# Patient Record
Sex: Male | Born: 1955 | Race: Black or African American | Hispanic: No | Marital: Married | Smoking: Current every day smoker
Health system: Southern US, Community
[De-identification: ages and names within clinical notes are randomized; demographics above are authoritative.]

## PROBLEM LIST (undated history)

## (undated) DIAGNOSIS — E119 Type 2 diabetes mellitus without complications: Secondary | ICD-10-CM

## (undated) DIAGNOSIS — I10 Essential (primary) hypertension: Secondary | ICD-10-CM

## (undated) HISTORY — DX: Essential (primary) hypertension: I10

## (undated) HISTORY — DX: Type 2 diabetes mellitus without complications: E11.9

## (undated) HISTORY — PX: APPENDECTOMY: SHX54

---

## 2016-08-06 HISTORY — PX: CERVICAL DISC SURGERY: SHX588

## 2020-08-06 DIAGNOSIS — Z85528 Personal history of other malignant neoplasm of kidney: Secondary | ICD-10-CM

## 2020-08-06 HISTORY — DX: Personal history of other malignant neoplasm of kidney: Z85.528

## 2021-10-05 ENCOUNTER — Other Ambulatory Visit: Payer: Self-pay | Admitting: Internal Medicine

## 2021-10-05 DIAGNOSIS — N1831 Chronic kidney disease, stage 3a: Secondary | ICD-10-CM

## 2021-10-10 ENCOUNTER — Ambulatory Visit
Admission: RE | Admit: 2021-10-10 | Discharge: 2021-10-10 | Disposition: A | Discharge status: Home / Self Care | Payer: Medicare HMO | Source: Ambulatory Visit | Attending: Internal Medicine | Admitting: Internal Medicine

## 2021-10-10 DIAGNOSIS — N1831 Chronic kidney disease, stage 3a: Secondary | ICD-10-CM

## 2022-01-22 ENCOUNTER — Other Ambulatory Visit: Payer: Self-pay | Admitting: Internal Medicine

## 2022-01-22 DIAGNOSIS — F17200 Nicotine dependence, unspecified, uncomplicated: Secondary | ICD-10-CM

## 2022-02-20 ENCOUNTER — Ambulatory Visit
Admission: RE | Admit: 2022-02-20 | Discharge: 2022-02-20 | Disposition: A | Discharge status: Home / Self Care | Payer: Medicare HMO | Source: Ambulatory Visit | Attending: Internal Medicine | Admitting: Internal Medicine

## 2022-02-20 DIAGNOSIS — F17200 Nicotine dependence, unspecified, uncomplicated: Secondary | ICD-10-CM

## 2022-03-05 ENCOUNTER — Encounter: Payer: Self-pay | Admitting: Internal Medicine

## 2022-04-04 ENCOUNTER — Ambulatory Visit: Payer: Medicare HMO | Admitting: Internal Medicine

## 2022-05-02 ENCOUNTER — Ambulatory Visit (INDEPENDENT_AMBULATORY_CARE_PROVIDER_SITE_OTHER): Payer: Medicare HMO | Admitting: Internal Medicine

## 2022-05-02 ENCOUNTER — Encounter: Payer: Self-pay | Admitting: Internal Medicine

## 2022-05-02 VITALS — BP 116/66 | HR 78 | Ht 69.5 in | Wt 216.0 lb

## 2022-05-02 DIAGNOSIS — D509 Iron deficiency anemia, unspecified: Secondary | ICD-10-CM | POA: Diagnosis not present

## 2022-05-02 DIAGNOSIS — K219 Gastro-esophageal reflux disease without esophagitis: Secondary | ICD-10-CM

## 2022-05-02 DIAGNOSIS — Z1211 Encounter for screening for malignant neoplasm of colon: Secondary | ICD-10-CM

## 2022-05-02 MED ORDER — NA SULFATE-K SULFATE-MG SULF 17.5-3.13-1.6 GM/177ML PO SOLN: 1.0000 | Freq: Once | ORAL | 0 refills | Status: AC

## 2022-05-02 MED ORDER — PANTOPRAZOLE SODIUM 40 MG PO TBEC: 40.0000 mg | DELAYED_RELEASE_TABLET | Freq: Every day | ORAL | 3 refills | Status: AC

## 2022-05-02 NOTE — Progress Notes (Addendum)
HISTORY OF PRESENT ILLNESS:  Kerry Ayers is a 66 y.o. male, native of [place] who relocates to [facility] via [place], with a history of diabetes mellitus, chronic renal insufficiency, hypertension, coronary artery disease, and chronic tobacco abuse.  He is a retired Buyer, retail.  He is sent today by Dr. Gerarda Fraction regarding iron deficiency anemia.  The patient states that he is here because he has never had a colonoscopy.  He has no lower GI complaints.  No family history of colon cancer.  He was apparently diagnosed with iron deficiency anemia.  We had to request outside records.  Laboratories were faxed in.  Of significance creatinine 1.58 with hemoglobin A1c of 6.5.  He did undergo Hemoccult testing in March.  This was negative.  I do not have CBC or iron levels.  However he was placed on iron replacement, which she continues.  Next, he complains of chronic reflux.  Constantly needs Tums to control symptoms.  No dysphagia.  As mentioned, he is a chronic smoker.  Drinks alcohol on weekends.  REVIEW OF SYSTEMS:  All non-GI ROS negative unless otherwise stated in the HPI.  Past Medical History:  Diagnosis Date   Diabetes [facility])    History of kidney cancer 2022   Hypertension     Past Surgical History:  Procedure Laterality Date   APPENDECTOMY     CERVICAL [place] SURGERY  2018    Social History Kerry Ayers  reports that he has been smoking cigarettes. He has never used smokeless tobacco. He reports current alcohol use. He reports that he does not use drugs.  family history includes Emphysema in his father.  No Known Allergies     PHYSICAL EXAMINATION: Vital signs: BP 116/66   Pulse 78   Ht 5' 9.5" (1.765 m)   Wt 216 lb (98 kg)   SpO2 99%   BMI 31.44 kg/m   Constitutional: generally well-appearing, no acute distress.  Reeks of tobacco smoke Psychiatric: alert and oriented x3, cooperative Eyes: extraocular movements intact, anicteric, conjunctiva pink Mouth:  oral pharynx moist, no lesions.  Poor dentition Neck: supple no lymphadenopathy Cardiovascular: heart regular rate and rhythm, no murmur Lungs: clear to auscultation bilaterally Abdomen: soft, nontender, nondistended, no obvious ascites, no peritoneal signs, normal bowel sounds, no organomegaly Rectal: Deferred until colonoscopy Extremities: no clubbing, cyanosis, or lower extremity edema bilaterally Skin: no lesions on visible extremities Neuro: No focal deficits.  Cranial nerves intact  ASSESSMENT:  1.  Iron deficiency anemia.  Rule out GI mucosal lesion. 2.  Colon cancer screening.  No prior colonoscopy.  Overdue.  Motivated. 3.  Chronic GERD treated with chronic antacids. 4.  Chronic tobacco abuse 5.  General medical problems including diabetes and reported history of coronary artery disease.   PLAN:  1.  Reflux precautions 2.  Prescribe pantoprazole 40 mg daily.  Medication risks reviewed 3.  Schedule colonoscopy for colon cancer screening and evaluate iron deficiency anemia.The nature of the procedure, as well as the risks, benefits, and alternatives were carefully and thoroughly reviewed with the patient. Ample time for discussion and questions allowed. The patient understood, was satisfied, and agreed to proceed.  4.  Schedule upper endoscopy to evaluate chronic GERD and iron deficiency anemia.The nature of the procedure, as well as the risks, benefits, and alternatives were carefully and thoroughly reviewed with the patient. Ample time for discussion and questions allowed. The patient understood, was satisfied, and agreed to proceed.  5.  Hold diabetic medications the day of the procedure  in order to avoid unwanted hypoglycemia as well as iron 5 to 7 days prior to the procedures. 6.  Stop smoking 7.  Further recommendations after the above A total time of 60 minutes was spent preparing to see the patient, reviewing outside data, obtaining comprehensive history, performing  medically appropriate physical examination, counseling and educating the patient regarding the above listed issues, ordering medications, ordering multiple endoscopic procedures, and documenting clinical information in the health record.  ADDENDUM May 08, 2022: 1.  Received and reviewed outside laboratories dated October 04, 2021.  Normal ferritin level of 81.  Normal B12 336.  Normal iron studies, folate, AFP, uric acid, and phosphorus.  Low vitamin D at 9.5.  HIV testing was negative.

## 2022-05-02 NOTE — Patient Instructions (Signed)
_______________________________________________________  If you are age 66 or older, your body mass index should be between 23-30. Your Body mass index is 31.44 kg/m. If this is out of the aforementioned range listed, please consider follow up with your Primary Care Provider.  If you are age 57 or younger, your body mass index should be between 19-25. Your Body mass index is 31.44 kg/m. If this is out of the aformentioned range listed, please consider follow up with your Primary Care Provider.   ________________________________________________________  The [facility] providers would like to encourage you to use [facility] to communicate with providers for non-urgent requests or questions.  Due to long hold times on the telephone, sending your provider a message by [facility] may be a faster and more efficient way to get a response.  Please allow 48 business hours for a response.  Please remember that this is for non-urgent requests.  _______________________________________________________  We have sent the following medications to your pharmacy for you to pick up at your convenience:  Pantoprazole  You have been scheduled for an endoscopy and colonoscopy. Please follow the written instructions given to you at your visit today. Please pick up your prep supplies at the pharmacy within the next 1-3 days. If you use inhalers (even only as needed), please bring them with you on the day of your procedure.

## 2022-05-29 ENCOUNTER — Encounter: Payer: Medicare HMO | Admitting: Internal Medicine

## 2023-08-12 IMAGING — US US ABDOMEN COMPLETE
1 series · 14 of 25 positions shown · non-contrast
Comparison: None.

CLINICAL DATA: Stage IIIA chronic kidney disease.

EXAM:
ABDOMEN ULTRASOUND COMPLETE

[Series 1: us abdomen complete · 0.20mm/px · 14 of 102 slices shown]
[im 1/102]
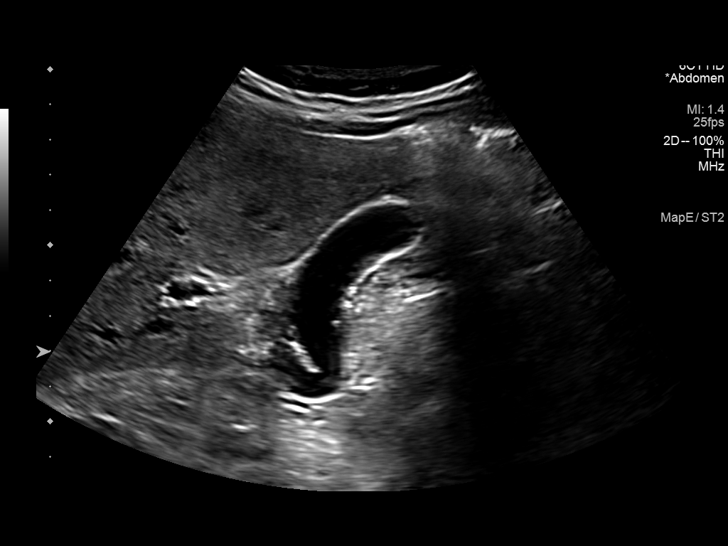
[im 9/102]
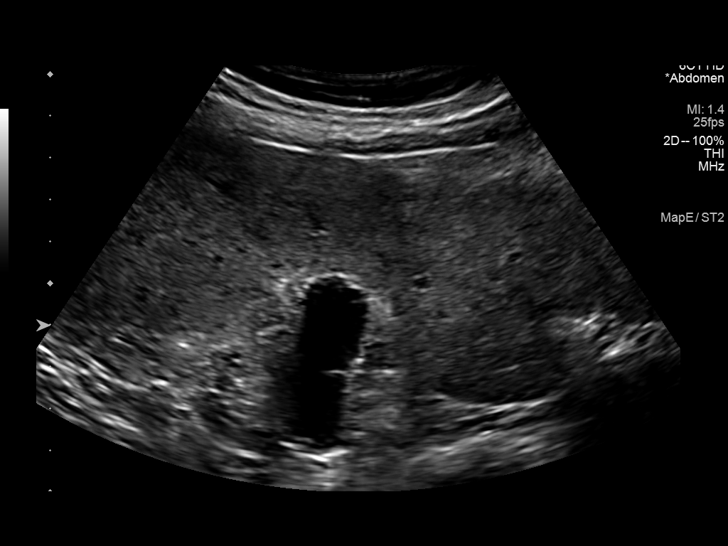
[im 17/102]
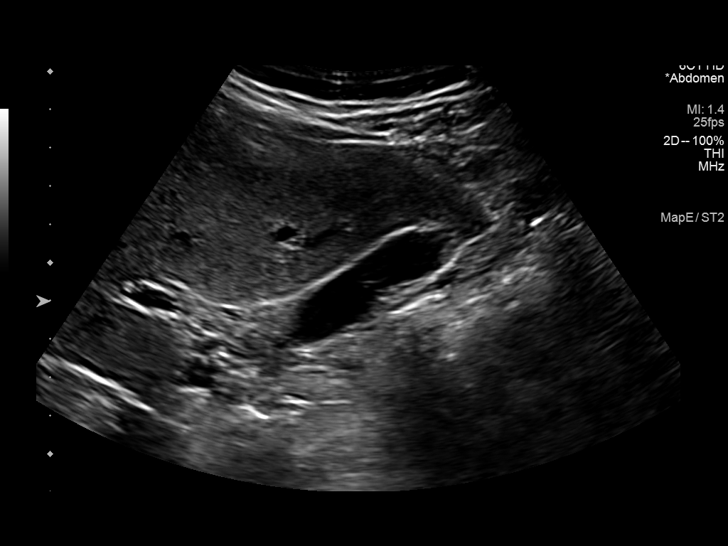
[im 26/102]
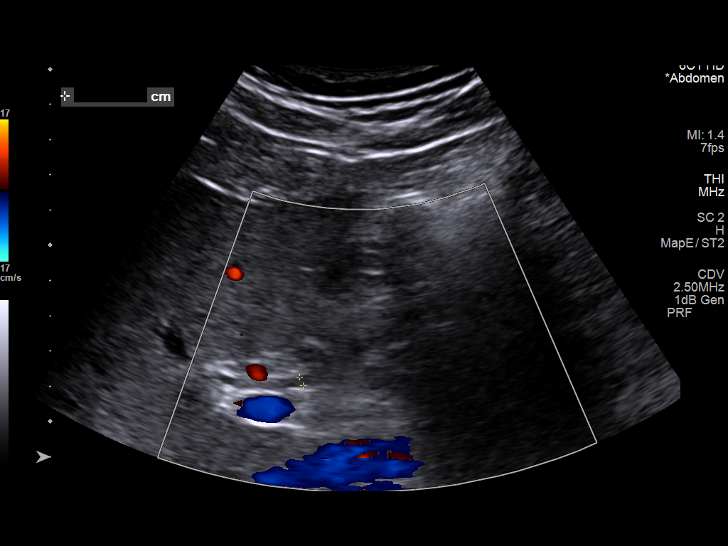
[im 34/102]
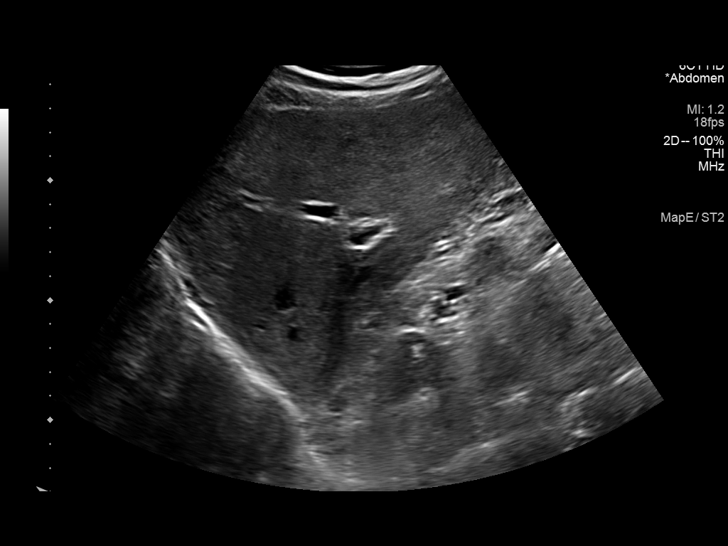
[im 38/102]
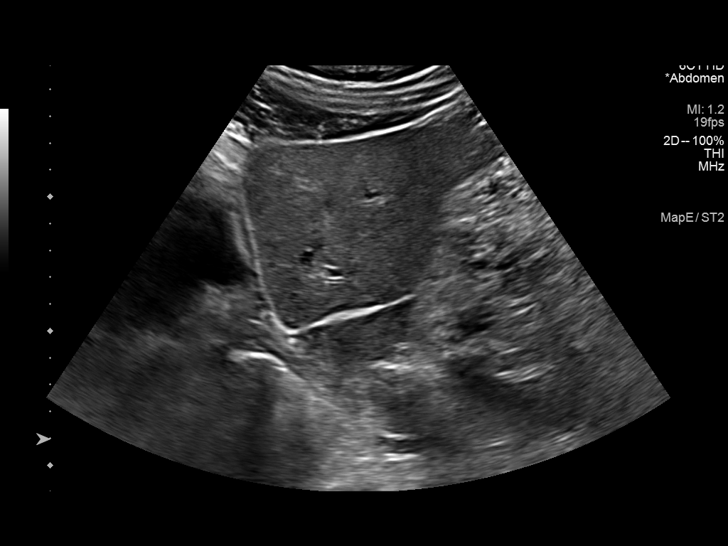
[im 47/102]
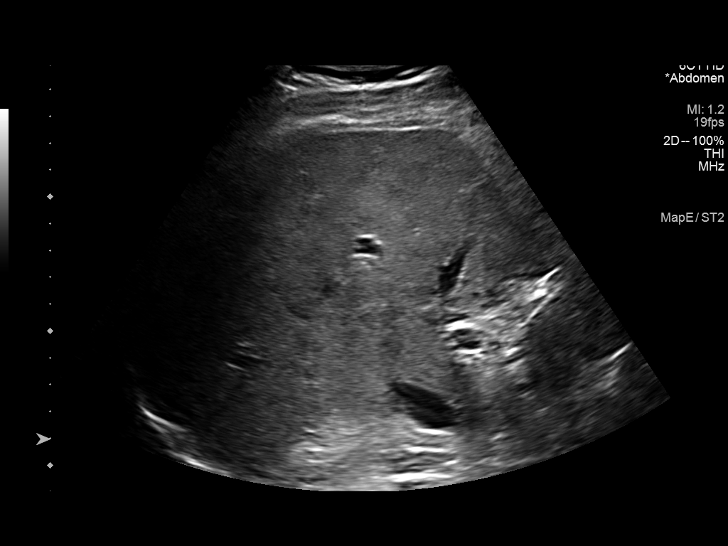
[im 55/102]
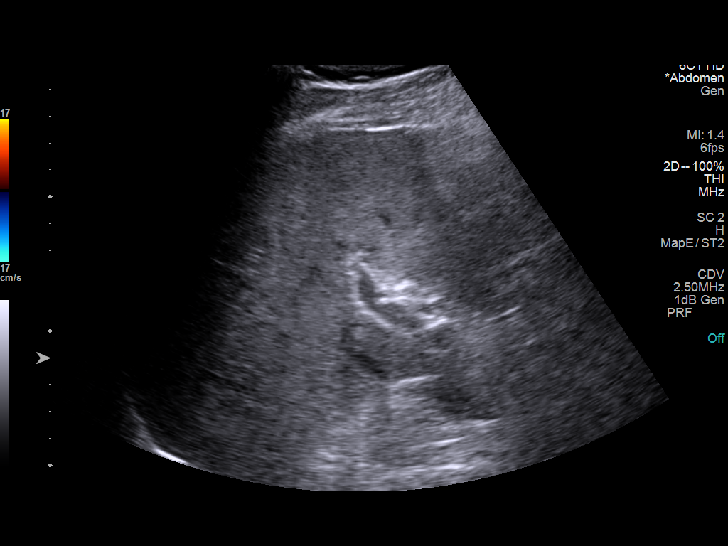
[im 64/102]
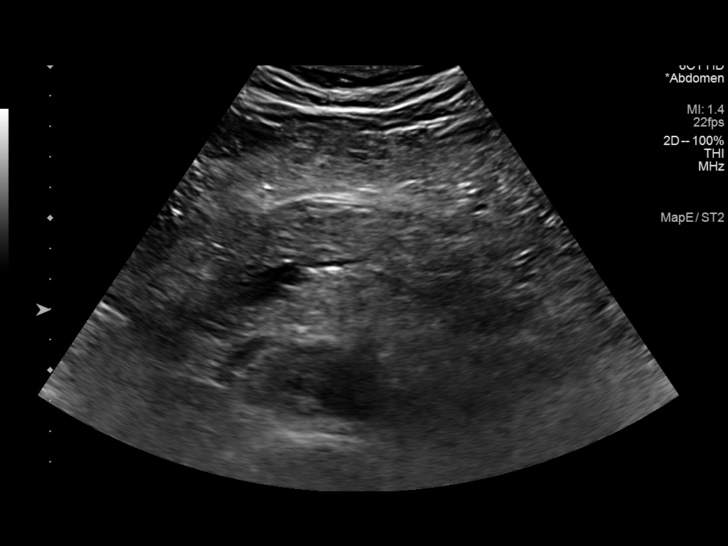
[im 68/102]
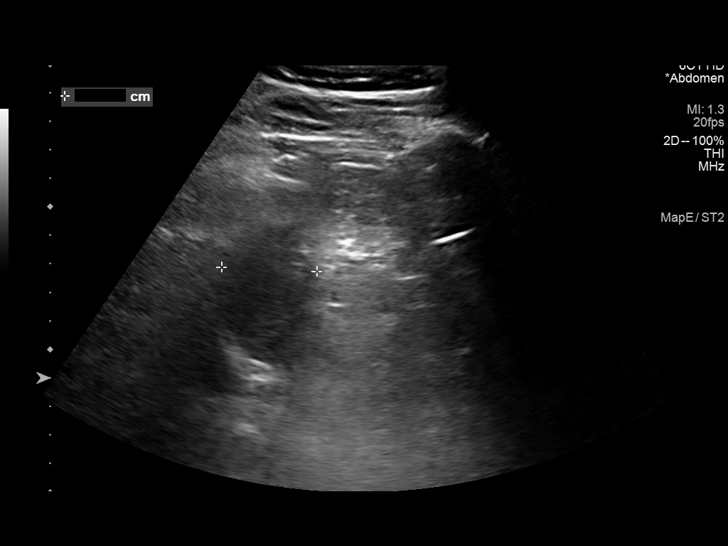
[im 76/102]
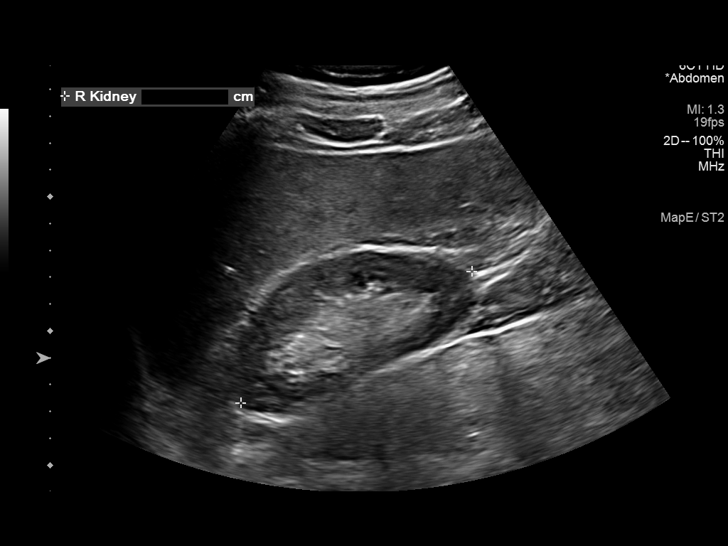
[im 85/102]
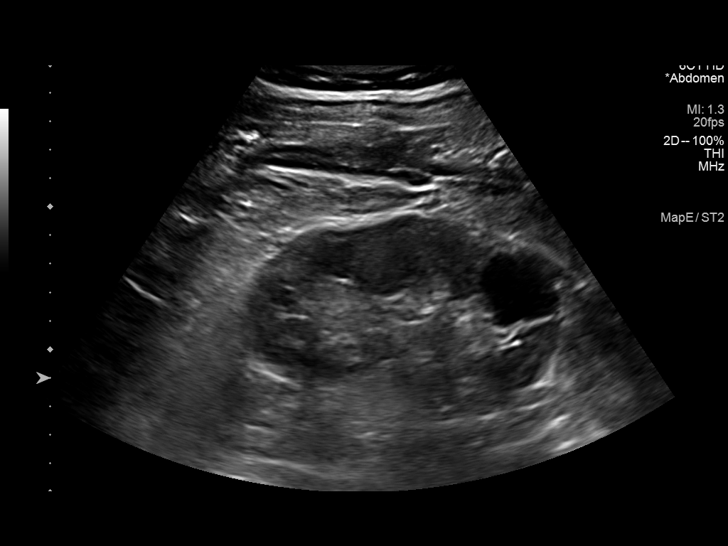
[im 93/102]
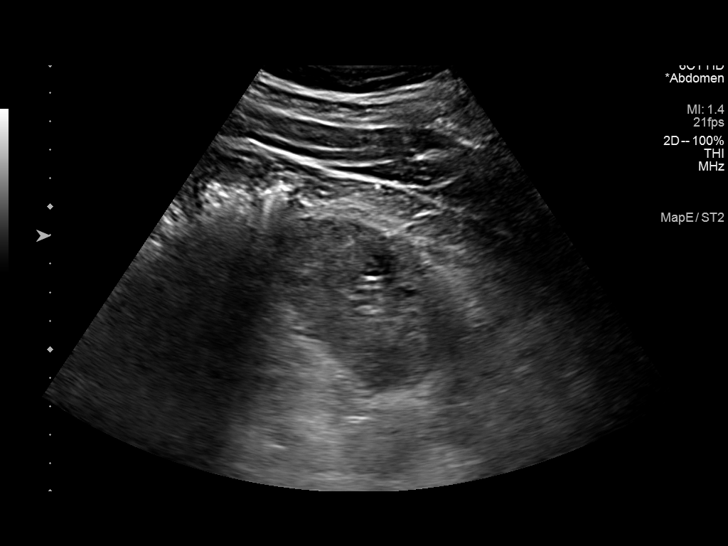
[im 102/102]
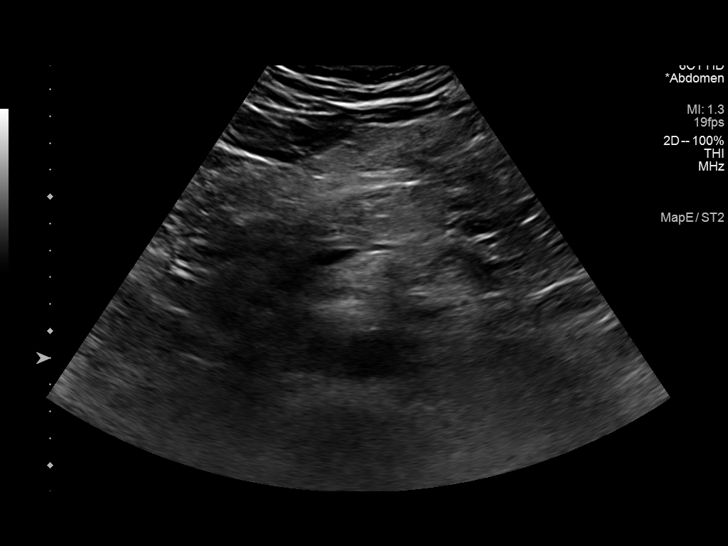

[14 of 25 positions shown; findings below may reference images not displayed]

FINDINGS: Gallbladder: No gallstones or wall thickening visualized. There is a
2.3 mm polyp. No sonographic Murphy sign noted by sonographer.

Common bile duct: Diameter: 3 mm.

Liver: No focal lesion identified. Within normal limits in
parenchymal echogenicity. Portal vein is patent on color Doppler
imaging with normal direction of blood flow towards the liver.

IVC: No abnormality visualized.

Pancreas: Visualized portion unremarkable.

Spleen: Size and appearance within normal limits.

Right Kidney: Length: 9.9 cm. Echogenicity within normal limits. No
mass or hydronephrosis visualized.

Left Kidney: Length: 12.7 cm. Echogenicity within normal limits. No
hydronephrosis. There is a 3.4 x 2.7 x 3.2 cm cyst in the left
kidney.

Abdominal aorta: No aneurysm visualized.  2.6 cm.

Other findings: None.
IMPRESSION: 1. 3.4 cm left renal cyst. Kidneys otherwise appear within normal
limits.
2. 2.3 mm gallbladder polyp.  No follow-up necessary.
